# Patient Record
Sex: Male | Born: 1955 | Race: White | Hispanic: No | Marital: Single
Health system: Southern US, Community
[De-identification: ages and names within clinical notes are randomized; demographics above are authoritative.]

---

## 2021-01-19 ENCOUNTER — Other Ambulatory Visit (HOSPITAL_COMMUNITY): Payer: Self-pay | Admitting: Urology

## 2021-01-19 DIAGNOSIS — C61 Malignant neoplasm of prostate: Secondary | ICD-10-CM

## 2021-01-31 ENCOUNTER — Ambulatory Visit (HOSPITAL_COMMUNITY)
Admission: RE | Admit: 2021-01-31 | Discharge: 2021-01-31 | Disposition: A | Discharge status: Home / Self Care | Payer: HMO | Source: Ambulatory Visit | Attending: Urology | Admitting: Urology

## 2021-01-31 DIAGNOSIS — C61 Malignant neoplasm of prostate: Secondary | ICD-10-CM | POA: Diagnosis present

## 2021-02-19 ENCOUNTER — Other Ambulatory Visit: Payer: Self-pay

## 2021-02-19 DIAGNOSIS — C61 Malignant neoplasm of prostate: Secondary | ICD-10-CM

## 2021-04-02 ENCOUNTER — Other Ambulatory Visit: Payer: Self-pay | Admitting: Genetic Counselor

## 2021-04-03 ENCOUNTER — Inpatient Hospital Stay: Payer: HMO | Attending: Hematology and Oncology | Admitting: Genetic Counselor

## 2021-04-03 ENCOUNTER — Inpatient Hospital Stay: Payer: HMO

## 2021-04-03 DIAGNOSIS — C61 Malignant neoplasm of prostate: Secondary | ICD-10-CM | POA: Diagnosis not present

## 2021-04-03 DIAGNOSIS — Z8042 Family history of malignant neoplasm of prostate: Secondary | ICD-10-CM

## 2021-04-03 DIAGNOSIS — Z808 Family history of malignant neoplasm of other organs or systems: Secondary | ICD-10-CM | POA: Diagnosis not present

## 2021-04-04 ENCOUNTER — Encounter: Payer: Self-pay | Admitting: Genetic Counselor

## 2021-04-04 DIAGNOSIS — Z8042 Family history of malignant neoplasm of prostate: Secondary | ICD-10-CM

## 2021-04-04 DIAGNOSIS — C61 Malignant neoplasm of prostate: Secondary | ICD-10-CM

## 2021-04-04 DIAGNOSIS — Z808 Family history of malignant neoplasm of other organs or systems: Secondary | ICD-10-CM

## 2021-04-04 HISTORY — DX: Family history of malignant neoplasm of prostate: Z80.42

## 2021-04-04 HISTORY — DX: Malignant neoplasm of prostate: C61

## 2021-04-04 HISTORY — DX: Family history of malignant neoplasm of other organs or systems: Z80.8

## 2021-04-04 NOTE — Progress Notes (Signed)
REFERRING PROVIDER: Gatha Mayer, MD [address] [place],  [place] [address]   PRIMARY PROVIDER:  Raina Mina., MD  PRIMARY REASON FOR VISIT:  1. Prostate cancer ([place])   2. Family history of prostate cancer   3. Family history of thyroid cancer     HISTORY OF PRESENT ILLNESS:   Sean Burton, a 65 y.o. male, was seen for a [place] cancer genetics consultation at the request of Dr. Bea Graff due to a personal and family history of cancer.  Sean Burton presents to clinic today to discuss the possibility of a hereditary predisposition to cancer, to discuss genetic testing, and to further clarify his future cancer risks, as well as potential cancer risks for family members.   In 2022, at the age of 65, Sean Burton was diagnosed with prostate cancer with Gleason 8/9.    RISK FACTORS:  Colonoscopy: no; not examined. Dermatology: no   Past Medical History:  Diagnosis Date   Family history of prostate cancer 04/04/2021   Family history of thyroid cancer 04/04/2021   Prostate cancer ([place]) 04/04/2021     FAMILY HISTORY:  We obtained a detailed, 4-generation family history.  Significant diagnoses are listed below: Family History  Problem Relation Age of Onset   Thyroid cancer Mother        dx before 89   Prostate cancer Father        dx late 71s    Sean Burton is unaware of previous family history of genetic testing for hereditary cancer risks. There is no reported Ashkenazi Jewish ancestry. There is no known consanguinity.  GENETIC COUNSELING ASSESSMENT: Sean Burton is a 65 y.o. male with a personal and family history of cancer which is somewhat suggestive of a hereditary cancer syndrome and predisposition to cancer given his grade of prostate cancer and the presence of other prostate cancer in the family. We, therefore, discussed and recommended the following at today's visit.   DISCUSSION: We discussed that 5 - 10% of cancer is hereditary.  Most cases of  hereditary prostate cancer are associated with mutations in BRCA1/2.  There are other genes that can be associated with hereditary prostate or thyroid cancer syndromes.  We discussed that testing is beneficial for several reasons including knowing how to follow individuals for their cancer risks, identifying whether potential treatment options would be beneficial, and understanding if other family members could be at risk for cancer and allowing them to undergo genetic testing.   We reviewed the characteristics, features and inheritance patterns of hereditary cancer syndromes. We also discussed genetic testing, including the appropriate family members to test, the process of testing, insurance coverage and turn-around-time for results. We discussed the implications of a negative, positive, carrier and/or variant of uncertain significant result. We recommended Sean Burton pursue genetic testing for a panel that includes genes associated with prostate and thyroid cancer cancer.   The CancerNext-Expanded gene panel offered by [facility] and includes sequencing, rearrangement, and RNA analysis for the following 77 genes: AIP, ALK, APC, ATM, AXIN2, BAP1, BARD1, BLM, BMPR1A, BRCA1, BRCA2, BRIP1, CDC73, CDH1, CDK4, CDKN1B, CDKN2A, CHEK2, CTNNA1, DICER1, FANCC, FH, FLCN, GALNT12, KIF1B, LZTR1, MAX, MEN1, MET, MLH1, MSH2, MSH3, MSH6, MUTYH, NBN, NF1, NF2, NTHL1, PALB2, PHOX2B, PMS2, POT1, PRKAR1A, PTCH1, PTEN, RAD51C, RAD51D, RB1, RECQL, RET, SDHA, SDHAF2, SDHB, SDHC, SDHD, SMAD4, SMARCA4, SMARCB1, SMARCE1, STK11, SUFU, TMEM127, TP53, TSC1, TSC2, VHL and XRCC2 (sequencing and deletion/duplication); EGFR, EGLN1, HOXB13, KIT, MITF, PDGFRA, POLD1, and POLE (sequencing only); EPCAM and  GREM1 (deletion/duplication only).   Based on Sean Burton's personal and family history of cancer, he meets medical criteria for genetic testing. Despite that he meets criteria, he may still have an out of pocket cost. We discussed  that if his out of pocket cost for testing is over $100, the laboratory should contact him and discuss the self-pay prices and/or patient pay assistance programs.    PLAN: After considering the risks, benefits, and limitations, Sean Burton provided informed consent to pursue genetic testing and the blood sample was sent to [facility] for analysis of the CancerNext-Expanded +RNAinsight Panel. Results should be available within approximately 3 weeks' time, at which point they will be disclosed by telephone to Sean Burton, as will any additional recommendations warranted by these results. Sean Burton will receive a summary of his genetic counseling visit and a copy of his results once available. This information will also be available in Epic.   Lastly, we encouraged Sean Burton to remain in contact with cancer genetics annually so that we can continuously update the family history and inform him of any changes in cancer genetics and testing that may be of benefit for this family.   Sean Burton questions were answered to his satisfaction today. Our contact information was provided should additional questions or concerns arise. Thank you for the referral and allowing [place] to share in the care of your patient.   Nikole Swartzentruber M. Joette Catching, [place], [facility] Genetic Counselor [email or web address] (P) [phone]  The patient was seen for a total of 30 minutes in face-to-face genetic counseling.  The patient was seen alone.  Drs. Lindi Adie and/or Burr Medico were available to discuss this case as needed.   _______________________________________________________________________ For Office Staff:  Number of people involved in session: 1 Was an Intern/ student involved with case: no

## 2021-04-25 ENCOUNTER — Encounter: Payer: Self-pay | Admitting: Genetic Counselor

## 2021-04-25 DIAGNOSIS — Z1379 Encounter for other screening for genetic and chromosomal anomalies: Secondary | ICD-10-CM | POA: Insufficient documentation

## 2021-04-30 ENCOUNTER — Telehealth: Payer: Self-pay | Admitting: Genetic Counselor

## 2021-04-30 NOTE — Telephone Encounter (Signed)
Contacted patient in attempt to disclose results of genetic testing.  LVM with contact information requesting a call back.  

## 2021-05-22 ENCOUNTER — Encounter: Payer: Self-pay | Admitting: Genetic Counselor

## 2021-05-22 NOTE — Telephone Encounter (Signed)
Contacted patient in attempt to disclose results of genetic testing.  LVM with contact information requesting a call back.  Third attempt.  Letter sent to patient address requesting call to disclose results.

## 2021-05-25 ENCOUNTER — Ambulatory Visit: Payer: Self-pay | Admitting: Genetic Counselor

## 2021-05-25 DIAGNOSIS — Z808 Family history of malignant neoplasm of other organs or systems: Secondary | ICD-10-CM

## 2021-05-25 DIAGNOSIS — Z8042 Family history of malignant neoplasm of prostate: Secondary | ICD-10-CM

## 2021-05-25 DIAGNOSIS — Z1379 Encounter for other screening for genetic and chromosomal anomalies: Secondary | ICD-10-CM

## 2021-05-25 DIAGNOSIS — C61 Malignant neoplasm of prostate: Secondary | ICD-10-CM

## 2021-05-27 NOTE — Progress Notes (Signed)
HPI:   Sean Burton was previously seen in the [facility] clinic due to a personal and family history of cancer and concerns regarding a hereditary predisposition to cancer. Please refer to our prior cancer genetics clinic note for more information regarding our discussion, assessment and recommendations, at the time. Sean Burton recent genetic test results were disclosed to him, as were recommendations warranted by these results. These results and recommendations are discussed in cermore detail below.  CANCER HISTORY:  Oncology History  Prostate cancer ([place])  04/04/2021 Initial Diagnosis   Prostate cancer ([place])   04/22/2021 Genetic Testing   Negative hereditary cancer genetic testing: no pathogenic variants detected in [facility] CancerNext-Expanded +RNAinsight Panel.  The report date is 04/22/2021.   The CancerNext-Expanded gene panel offered by [facility] and includes sequencing, rearrangement, and RNA analysis for the following 77 genes: AIP, ALK, APC, ATM, AXIN2, BAP1, BARD1, BLM, BMPR1A, BRCA1, BRCA2, BRIP1, CDC73, CDH1, CDK4, CDKN1B, CDKN2A, CHEK2, CTNNA1, DICER1, FANCC, FH, FLCN, GALNT12, KIF1B, LZTR1, MAX, MEN1, MET, MLH1, MSH2, MSH3, MSH6, MUTYH, NBN, NF1, NF2, NTHL1, PALB2, PHOX2B, PMS2, POT1, PRKAR1A, PTCH1, PTEN, RAD51C, RAD51D, RB1, RECQL, RET, SDHA, SDHAF2, SDHB, SDHC, SDHD, SMAD4, SMARCA4, SMARCB1, SMARCE1, STK11, SUFU, TMEM127, TP53, TSC1, TSC2, VHL and XRCC2 (sequencing and deletion/duplication); EGFR, EGLN1, HOXB13, KIT, MITF, PDGFRA, POLD1, and POLE (sequencing only); EPCAM and GREM1 (deletion/duplication only).      FAMILY HISTORY:  We obtained a detailed, 4-generation family history.  Significant diagnoses are listed below: Family History  Problem Relation Age of Onset   Thyroid cancer Mother        dx before 20   Prostate cancer Father        dx late 90s    Sean Burton is unaware of previous family history of genetic testing for hereditary cancer  risks. There is no reported Ashkenazi Jewish ancestry. There is no known consanguinity.    GENETIC TEST RESULTS:  The [facility] CancerNext-Expanded +RNAinsight Panel found no pathogenic mutations. The CancerNext-Expanded gene panel offered by [facility] and includes sequencing, rearrangement, and RNA analysis for the following 77 genes: AIP, ALK, APC, ATM, AXIN2, BAP1, BARD1, BLM, BMPR1A, BRCA1, BRCA2, BRIP1, CDC73, CDH1, CDK4, CDKN1B, CDKN2A, CHEK2, CTNNA1, DICER1, FANCC, FH, FLCN, GALNT12, KIF1B, LZTR1, MAX, MEN1, MET, MLH1, MSH2, MSH3, MSH6, MUTYH, NBN, NF1, NF2, NTHL1, PALB2, PHOX2B, PMS2, POT1, PRKAR1A, PTCH1, PTEN, RAD51C, RAD51D, RB1, RECQL, RET, SDHA, SDHAF2, SDHB, SDHC, SDHD, SMAD4, SMARCA4, SMARCB1, SMARCE1, STK11, SUFU, TMEM127, TP53, TSC1, TSC2, VHL and XRCC2 (sequencing and deletion/duplication); EGFR, EGLN1, HOXB13, KIT, MITF, PDGFRA, POLD1, and POLE (sequencing only); EPCAM and GREM1 (deletion/duplication only).   The test report has been scanned into EPIC and is located under the Molecular Pathology section of the Results Review tab.  A portion of the result report is included below for reference. Genetic testing reported out on April 22, 2021.       Even though a pathogenic variant was not identified, possible explanations for the cancer in the family may include: There may be no hereditary risk for cancer in the family. The cancers in Sean Burton and/or his family may be sporadic/familial or due to other genetic and environmental factors. There may be a gene mutation in one of these genes that current testing methods cannot detect but that chance is small. There could be another gene that has not yet been discovered, or that we have not yet tested, that is responsible for the cancer diagnoses in the family.  It is also possible  there is a hereditary cause for the cancer in the family that Sean Burton did not inherit.  Therefore, it is important to remain in touch with cancer  genetics in the future so that we can continue to offer Sean Burton the most up to date genetic testing.   ADDITIONAL GENETIC TESTING:   We discussed with Sean Burton that his genetic testing was fairly extensive.  If there are genes identified to increase cancer risk that can be analyzed in the future, we would be happy to discuss and coordinate this testing at that time.    CANCER SCREENING RECOMMENDATIONS:  Sean Burton test result is considered negative (normal).  This means that we have not identified a hereditary cause for his personal history of prostate cancer at this time.   RECOMMENDATIONS FOR FAMILY MEMBERS:   Individuals in this family might be at some increased risk of developing cancer, over the general population risk, due to the family history of cancer.  Individuals in the family should notify their providers of the family history of cancer. We recommend women in this family have a yearly mammogram beginning at age 48, or 82 years younger than the earliest onset of cancer, an annual clinical breast exam, and perform monthly breast self-exams.  Males in the family should speak with their providers about prostate cancer screening.    FOLLOW-UP:  Lastly, we discussed with Sean Burton that cancer genetics is a rapidly advancing field and it is possible that new genetic tests will be appropriate for him and/or his family members in the future. We encouraged him to remain in contact with cancer genetics on an annual basis so we can update his personal and family histories and let him know of advances in cancer genetics that may benefit this family.   Our contact number was provided. Sean Burton questions were answered to his satisfaction, and he knows he is welcome to call us at anytime with additional questions or concerns.   Tanae Petrosky M. Joette Catching, [place], [facility] Genetic Counselor [email or web address] (P) [phone]

## 2021-05-27 NOTE — Telephone Encounter (Signed)
Revealed negative genetic testing.  Discussed that we do not know why he has prostate cancer or why there is cancer in the family. It could be sporadic/familial, due to a different gene that we are not testing, or maybe our current technology may not be able to pick something up.  It will be important for him to keep in contact with genetics to keep up with whether additional testing may be needed.

## 2023-07-22 IMAGING — CT NM PET TUM IMG SKULL BASE T - THIGH
1 of 7 series · 1 of 25 positions shown · non-contrast
Comparison: 01/15/2021 abdominopelvic CT.

CLINICAL DATA: New diagnosis of prostate cancer earlier this year.
No therapy.

EXAM:
NUCLEAR MEDICINE PET SKULL BASE TO THIGH
TECHNIQUE: 9.5 mCi F18 Piflufolastat (Pylarify) was injected intravenously.
Full-ring PET imaging was performed from the skull base to thigh
after the radiotracer. CT data was obtained and used for attenuation
correction and anatomic localization.

[Series 3: pet sk_thigh ac · axial · 5.0mm · 4.07mm/px · 1 of 273 slices shown]
[im 164/273]
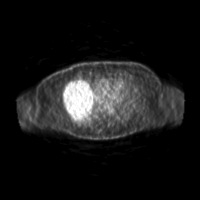

[1 of 25 positions shown; findings below may reference images not displayed]

FINDINGS: Mild limitations secondary to patient body habitus.

NECK

No radiotracer activity in neck lymph nodes.

Incidental CT finding: Bilateral carotid atherosclerosis. Mildly
prominent bilateral posterior triangle nodes including at 7 mm, not
tracer avid. Right parietal craniotomy.

CHEST

No radiotracer accumulation within mediastinal or hilar lymph nodes.

Incidental CT finding: Aortic and coronary artery calcification.
Right hemidiaphragm elevation.

ABDOMEN/PELVIS

Prostate: Multifocal, left greater than right prostatic tracer
affinity. Focal tracer uptake corresponding to an area of left
prostatic base and seminal vesicle soft tissue fullness. Example at
a S.U.V. max of 16.9 on 218/4. Soft tissue extends towards the
bladder, without gross invasion.

At the left apex, tracer affinity corresponds to soft tissue
fullness and subtle blunting of the rectoprostatic angle. Example at
a S.U.V. max of 7.3 on 224/4.

Lymph nodes: Left pelvic soft tissue density on the prior CT
corresponds to tracer uptake today. Favored to represent an
obturator node including at 2.3 x 1.8 cm and a S.U.V. max of 4.3 on
205/4.

Bilateral small inguinal nodes demonstrate low-level tracer uptake.
Example right inguinal node at 11 mm and a S.U.V. max of 2.4 on
237/4.

Liver: No evidence of liver metastasis

Incidental CT finding: Deferred to recent diagnostic CT. Small
bilateral hydroceles. Left adrenal adenoma included 2.0 cm.
Abdominal aortic atherosclerosis. Bilateral fat containing inguinal
hernias.

SKELETON

No abnormal marrow activity.

Degenerative partial fusion of the bilateral sacroiliac joints. L5
pars defects.
IMPRESSION: 1. Multifocal prostatic uptake, greater left than right. Findings
suspicious for locally advanced disease, including seminal vesicle
involvement at the left base and transcapsular spread at the left
apex.
2. Isolated left obturator nodal metastasis. Bilateral small
inguinal nodes with low-level uptake, favored to be benign/reactive.
3. Incidental findings, including: Coronary artery atherosclerosis.
Aortic Atherosclerosis ([id number]-U38.8). Left adrenal adenoma.
